# Patient Record
Sex: Male | Born: 2000 | Race: White | Hispanic: No | Marital: Single | Smoking: Never smoker
Health system: Southern US, Community
[De-identification: ages and names within clinical notes are randomized; demographics above are authoritative.]

## PROBLEM LIST (undated history)

## (undated) DIAGNOSIS — S60211A Contusion of right wrist, initial encounter: Secondary | ICD-10-CM

## (undated) DIAGNOSIS — R41840 Attention and concentration deficit: Secondary | ICD-10-CM

## (undated) DIAGNOSIS — F909 Attention-deficit hyperactivity disorder, unspecified type: Secondary | ICD-10-CM

## (undated) DIAGNOSIS — R1110 Vomiting, unspecified: Secondary | ICD-10-CM

## (undated) DIAGNOSIS — F329 Major depressive disorder, single episode, unspecified: Secondary | ICD-10-CM

## (undated) DIAGNOSIS — H539 Unspecified visual disturbance: Secondary | ICD-10-CM

## (undated) DIAGNOSIS — N209 Urinary calculus, unspecified: Secondary | ICD-10-CM

## (undated) DIAGNOSIS — T7840XA Allergy, unspecified, initial encounter: Secondary | ICD-10-CM

## (undated) DIAGNOSIS — R4689 Other symptoms and signs involving appearance and behavior: Secondary | ICD-10-CM

## (undated) DIAGNOSIS — S52521A Torus fracture of lower end of right radius, initial encounter for closed fracture: Secondary | ICD-10-CM

## (undated) DIAGNOSIS — K219 Gastro-esophageal reflux disease without esophagitis: Secondary | ICD-10-CM

## (undated) DIAGNOSIS — F32A Depression, unspecified: Secondary | ICD-10-CM

## (undated) DIAGNOSIS — F419 Anxiety disorder, unspecified: Secondary | ICD-10-CM

## (undated) DIAGNOSIS — G44229 Chronic tension-type headache, not intractable: Secondary | ICD-10-CM

## (undated) HISTORY — DX: Other symptoms and signs involving appearance and behavior: R46.89

## (undated) HISTORY — DX: Torus fracture of lower end of right radius, initial encounter for closed fracture: S52.521A

## (undated) HISTORY — DX: Gastro-esophageal reflux disease without esophagitis: K21.9

## (undated) HISTORY — DX: Chronic tension-type headache, not intractable: G44.229

## (undated) HISTORY — DX: Contusion of right wrist, initial encounter: S60.211A

## (undated) HISTORY — DX: Urinary calculus, unspecified: N20.9

## (undated) HISTORY — DX: Vomiting, unspecified: R11.10

## (undated) HISTORY — DX: Attention-deficit hyperactivity disorder, unspecified type: F90.9

## (undated) HISTORY — DX: Unspecified visual disturbance: H53.9

---
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
## 2020-06-30 ENCOUNTER — Emergency Department (HOSPITAL_COMMUNITY)
Admission: EM | Admit: 2020-06-30 | Discharge: 2020-07-01 | Disposition: A | Discharge status: Home / Self Care | Payer: 59 | Attending: Emergency Medicine | Admitting: Emergency Medicine

## 2020-06-30 ENCOUNTER — Other Ambulatory Visit: Payer: Self-pay

## 2020-06-30 DIAGNOSIS — N281 Cyst of kidney, acquired: Secondary | ICD-10-CM | POA: Diagnosis not present

## 2020-06-30 DIAGNOSIS — R109 Unspecified abdominal pain: Secondary | ICD-10-CM | POA: Diagnosis present

## 2020-06-30 DIAGNOSIS — K219 Gastro-esophageal reflux disease without esophagitis: Secondary | ICD-10-CM | POA: Diagnosis not present

## 2020-06-30 DIAGNOSIS — K760 Fatty (change of) liver, not elsewhere classified: Secondary | ICD-10-CM | POA: Diagnosis not present

## 2020-06-30 DIAGNOSIS — J029 Acute pharyngitis, unspecified: Secondary | ICD-10-CM

## 2020-06-30 NOTE — ED Notes (Addendum)
Pt. Felt like he had strep throat this morning and now his kidney are " killing him".   Hx kidney stones.

## 2020-07-01 ENCOUNTER — Emergency Department (HOSPITAL_COMMUNITY): Payer: 59

## 2020-07-01 LAB — CBC WITH DIFFERENTIAL/PLATELET
Abs Immature Granulocytes: 0.06 10*3/uL (ref 0.00–0.07)
Basophils Absolute: 0 10*3/uL (ref 0.0–0.1)
Basophils Relative: 0 %
Eosinophils Absolute: 0 10*3/uL (ref 0.0–0.5)
Eosinophils Relative: 0 %
HCT: 45.3 % (ref 39.0–52.0)
Hemoglobin: 15.3 g/dL (ref 13.0–17.0)
Immature Granulocytes: 0 %
Lymphocytes Relative: 7 %
Lymphs Abs: 1.2 10*3/uL (ref 0.7–4.0)
MCH: 28.9 pg (ref 26.0–34.0)
MCHC: 33.8 g/dL (ref 30.0–36.0)
MCV: 85.6 fL (ref 80.0–100.0)
Monocytes Absolute: 1 10*3/uL (ref 0.1–1.0)
Monocytes Relative: 6 %
Neutro Abs: 14.7 10*3/uL — ABNORMAL HIGH (ref 1.7–7.7)
Neutrophils Relative %: 87 %
Platelets: 233 10*3/uL (ref 150–400)
RBC: 5.29 MIL/uL (ref 4.22–5.81)
RDW: 12.5 % (ref 11.5–15.5)
WBC: 16.9 10*3/uL — ABNORMAL HIGH (ref 4.0–10.5)
nRBC: 0 % (ref 0.0–0.2)

## 2020-07-01 LAB — URINALYSIS, ROUTINE W REFLEX MICROSCOPIC
Bilirubin Urine: NEGATIVE
Glucose, UA: NEGATIVE mg/dL
Hgb urine dipstick: NEGATIVE
Ketones, ur: 80 mg/dL — AB
Leukocytes,Ua: NEGATIVE
Nitrite: NEGATIVE
Protein, ur: 100 mg/dL — AB
Specific Gravity, Urine: 1.028 (ref 1.005–1.030)
pH: 5 (ref 5.0–8.0)

## 2020-07-01 LAB — BASIC METABOLIC PANEL
Anion gap: 12 (ref 5–15)
BUN: 12 mg/dL (ref 6–20)
CO2: 22 mmol/L (ref 22–32)
Calcium: 9.4 mg/dL (ref 8.9–10.3)
Chloride: 104 mmol/L (ref 98–111)
Creatinine, Ser: 0.8 mg/dL (ref 0.61–1.24)
GFR calc Af Amer: >60 mL/min (ref >60)
GFR calc non Af Amer: >60 mL/min (ref >60)
Glucose, Bld: 107 mg/dL — ABNORMAL HIGH (ref 70–99)
Potassium: 3.5 mmol/L (ref 3.5–5.1)
Sodium: 138 mmol/L (ref 135–145)

## 2020-07-01 LAB — GROUP A STREP BY PCR: Group A Strep by PCR: DETECTED — AB

## 2020-07-01 MED ORDER — PENICILLIN G BENZATHINE 1200000 UNIT/2ML IM SUSP
1.2000 10*6.[IU] | Freq: Once | INTRAMUSCULAR | Status: AC
  Administered 2020-07-01: 1.2 10*6.[IU] via INTRAMUSCULAR
  Filled 2020-07-01: qty 2

## 2020-07-01 MED ORDER — KETOROLAC TROMETHAMINE 30 MG/ML IJ SOLN
30.0000 mg | Freq: Once | INTRAMUSCULAR | Status: AC
  Administered 2020-07-01: 30 mg via INTRAVENOUS
  Filled 2020-07-01: qty 1

## 2020-07-01 NOTE — ED Notes (Signed)
Patient discharged no concerns at this time  
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
## 2020-07-01 NOTE — Discharge Instructions (Addendum)
You strep test was positive.  The penicillin you are given in the emergency department should treat this completely.  You can take Tylenol for pain.  You did have some kidney stones within the kidney along with a possible cyst on your kidney.  You should follow-up with your doctor, you may need a kidney ultrasound at a future point in time.Marland Kitchen

## 2020-08-31 ENCOUNTER — Emergency Department (HOSPITAL_COMMUNITY): Payer: 59

## 2020-08-31 ENCOUNTER — Emergency Department (HOSPITAL_COMMUNITY)
Admission: EM | Admit: 2020-08-31 | Discharge: 2020-08-31 | Disposition: A | Discharge status: Home / Self Care | Payer: 59 | Attending: Emergency Medicine | Admitting: Emergency Medicine

## 2020-08-31 ENCOUNTER — Encounter (HOSPITAL_COMMUNITY): Payer: Self-pay

## 2020-08-31 ENCOUNTER — Other Ambulatory Visit: Payer: Self-pay

## 2020-08-31 DIAGNOSIS — R109 Unspecified abdominal pain: Secondary | ICD-10-CM | POA: Diagnosis present

## 2020-08-31 DIAGNOSIS — N200 Calculus of kidney: Secondary | ICD-10-CM | POA: Insufficient documentation

## 2020-08-31 DIAGNOSIS — R319 Hematuria, unspecified: Secondary | ICD-10-CM | POA: Insufficient documentation

## 2020-08-31 LAB — URINALYSIS, ROUTINE W REFLEX MICROSCOPIC
Bacteria, UA: NONE SEEN
Bilirubin Urine: NEGATIVE
Glucose, UA: NEGATIVE mg/dL
Ketones, ur: NEGATIVE mg/dL
Leukocytes,Ua: NEGATIVE
Nitrite: NEGATIVE
Protein, ur: NEGATIVE mg/dL
Specific Gravity, Urine: 1.013 (ref 1.005–1.030)
pH: 6 (ref 5.0–8.0)

## 2020-08-31 MED ORDER — ONDANSETRON 4 MG PO TBDP: 4.0000 mg | ORAL_TABLET | Freq: Three times a day (TID) | ORAL | 0 refills | Status: DC | PRN

## 2020-08-31 MED ORDER — HYDROCODONE-ACETAMINOPHEN 5-325 MG PO TABS: 1.0000 | ORAL_TABLET | ORAL | 0 refills | Status: AC | PRN

## 2020-08-31 MED ORDER — TAMSULOSIN HCL 0.4 MG PO CAPS: 0.4000 mg | ORAL_CAPSULE | Freq: Every day | ORAL | 0 refills | Status: DC

## 2020-08-31 NOTE — Discharge Instructions (Signed)
Return if any problems.
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
## 2020-08-31 NOTE — ED Triage Notes (Signed)
patient c/o intermittent left flank pain and hematuria since approx 0300 today. patient states he has known kidney stones.

## 2021-05-30 ENCOUNTER — Other Ambulatory Visit: Payer: Self-pay

## 2021-05-30 ENCOUNTER — Emergency Department (HOSPITAL_COMMUNITY)
Admission: EM | Admit: 2021-05-30 | Discharge: 2021-05-30 | Disposition: A | Discharge status: Home / Self Care | Payer: 59 | Attending: Emergency Medicine | Admitting: Emergency Medicine

## 2021-05-30 ENCOUNTER — Encounter (HOSPITAL_COMMUNITY): Payer: Self-pay | Admitting: Emergency Medicine

## 2021-05-30 ENCOUNTER — Emergency Department (HOSPITAL_COMMUNITY): Payer: 59

## 2021-05-30 DIAGNOSIS — K219 Gastro-esophageal reflux disease without esophagitis: Secondary | ICD-10-CM | POA: Insufficient documentation

## 2021-05-30 DIAGNOSIS — N23 Unspecified renal colic: Secondary | ICD-10-CM

## 2021-05-30 DIAGNOSIS — R109 Unspecified abdominal pain: Secondary | ICD-10-CM | POA: Diagnosis present

## 2021-05-30 DIAGNOSIS — N200 Calculus of kidney: Secondary | ICD-10-CM

## 2021-05-30 LAB — BASIC METABOLIC PANEL
Anion gap: 9 (ref 5–15)
BUN: 15 mg/dL (ref 6–20)
CO2: 25 mmol/L (ref 22–32)
Calcium: 9.3 mg/dL (ref 8.9–10.3)
Chloride: 110 mmol/L (ref 98–111)
Creatinine, Ser: 1.09 mg/dL (ref 0.61–1.24)
GFR, Estimated: >60 mL/min (ref >60)
Glucose, Bld: 118 mg/dL — ABNORMAL HIGH (ref 70–99)
Potassium: 3.7 mmol/L (ref 3.5–5.1)
Sodium: 144 mmol/L (ref 135–145)

## 2021-05-30 LAB — URINALYSIS, ROUTINE W REFLEX MICROSCOPIC
Bilirubin Urine: NEGATIVE
Glucose, UA: NEGATIVE mg/dL
Ketones, ur: NEGATIVE mg/dL
Leukocytes,Ua: NEGATIVE
Nitrite: NEGATIVE
Protein, ur: 30 mg/dL — AB
RBC / HPF: >50 RBC/hpf — ABNORMAL HIGH (ref 0–5)
Specific Gravity, Urine: 1.013 (ref 1.005–1.030)
pH: 6 (ref 5.0–8.0)

## 2021-05-30 LAB — CBC
HCT: 43.1 % (ref 39.0–52.0)
Hemoglobin: 14.1 g/dL (ref 13.0–17.0)
MCH: 29.1 pg (ref 26.0–34.0)
MCHC: 32.7 g/dL (ref 30.0–36.0)
MCV: 88.9 fL (ref 80.0–100.0)
Platelets: 207 10*3/uL (ref 150–400)
RBC: 4.85 MIL/uL (ref 4.22–5.81)
RDW: 12.5 % (ref 11.5–15.5)
WBC: 9.4 10*3/uL (ref 4.0–10.5)
nRBC: 0 % (ref 0.0–0.2)

## 2021-05-30 MED ORDER — OXYCODONE HCL 5 MG PO TABS: 2.5000 mg | ORAL_TABLET | Freq: Four times a day (QID) | ORAL | 0 refills | Status: DC | PRN

## 2021-05-30 MED ORDER — ONDANSETRON HCL 4 MG/2ML IJ SOLN
4.0000 mg | Freq: Once | INTRAMUSCULAR | Status: AC
  Administered 2021-05-30: 4 mg via INTRAVENOUS
  Filled 2021-05-30: qty 2

## 2021-05-30 MED ORDER — ONDANSETRON HCL 4 MG PO TABS: 4.0000 mg | ORAL_TABLET | Freq: Three times a day (TID) | ORAL | 0 refills | Status: DC | PRN

## 2021-05-30 MED ORDER — TAMSULOSIN HCL 0.4 MG PO CAPS: 0.4000 mg | ORAL_CAPSULE | Freq: Two times a day (BID) | ORAL | 0 refills | Status: DC

## 2021-05-30 MED ORDER — MORPHINE SULFATE (PF) 4 MG/ML IV SOLN
4.0000 mg | Freq: Once | INTRAVENOUS | Status: AC
  Administered 2021-05-30: 4 mg via INTRAVENOUS
  Filled 2021-05-30: qty 1

## 2021-05-30 MED ORDER — KETOROLAC TROMETHAMINE 10 MG PO TABS: 10.0000 mg | ORAL_TABLET | Freq: Four times a day (QID) | ORAL | 0 refills | Status: DC | PRN

## 2021-05-30 MED ORDER — KETOROLAC TROMETHAMINE 30 MG/ML IJ SOLN
30.0000 mg | Freq: Once | INTRAMUSCULAR | Status: AC
  Administered 2021-05-30: 30 mg via INTRAVENOUS
  Filled 2021-05-30: qty 1

## 2021-05-30 NOTE — Discharge Instructions (Addendum)
Return to the ED immediately if you develop fever, uncontrolled pain or vomiting, or other concerns. ° °

## 2021-05-30 NOTE — ED Notes (Signed)
Patient transported to CT 
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
## 2021-05-30 NOTE — ED Notes (Signed)
Pt said he cannot use the bathroom at this time

## 2021-05-30 NOTE — ED Triage Notes (Signed)
Patient presents with sudden onset of right flank pain that began around 0430 this morning. Patient took a vicodin that he had from a previous prescription approx. 45 minutes ago without relief. Patient endorses N/V. Patient states urine is dark.   Hx of kidney stones, known retained stones in his kidney.

## 2021-08-30 ENCOUNTER — Encounter (HOSPITAL_BASED_OUTPATIENT_CLINIC_OR_DEPARTMENT_OTHER): Payer: Self-pay | Admitting: Urology

## 2021-08-30 ENCOUNTER — Other Ambulatory Visit: Payer: Self-pay

## 2021-08-30 ENCOUNTER — Emergency Department (HOSPITAL_BASED_OUTPATIENT_CLINIC_OR_DEPARTMENT_OTHER): Payer: 59

## 2021-08-30 ENCOUNTER — Emergency Department (HOSPITAL_BASED_OUTPATIENT_CLINIC_OR_DEPARTMENT_OTHER)
Admission: EM | Admit: 2021-08-30 | Discharge: 2021-08-31 | Disposition: A | Discharge status: Home / Self Care | Payer: 59 | Attending: Emergency Medicine | Admitting: Emergency Medicine

## 2021-08-30 DIAGNOSIS — J101 Influenza due to other identified influenza virus with other respiratory manifestations: Secondary | ICD-10-CM | POA: Diagnosis not present

## 2021-08-30 DIAGNOSIS — Z20822 Contact with and (suspected) exposure to covid-19: Secondary | ICD-10-CM | POA: Diagnosis not present

## 2021-08-30 DIAGNOSIS — R509 Fever, unspecified: Secondary | ICD-10-CM | POA: Diagnosis present

## 2021-08-30 LAB — TROPONIN I (HIGH SENSITIVITY): Troponin I (High Sensitivity): 3 ng/L (ref <18)

## 2021-08-30 LAB — CBC
HCT: 44.1 % (ref 39.0–52.0)
Hemoglobin: 15 g/dL (ref 13.0–17.0)
MCH: 29 pg (ref 26.0–34.0)
MCHC: 34 g/dL (ref 30.0–36.0)
MCV: 85.1 fL (ref 80.0–100.0)
Platelets: 220 10*3/uL (ref 150–400)
RBC: 5.18 MIL/uL (ref 4.22–5.81)
RDW: 12.4 % (ref 11.5–15.5)
WBC: 6.9 10*3/uL (ref 4.0–10.5)
nRBC: 0 % (ref 0.0–0.2)

## 2021-08-30 LAB — RESP PANEL BY RT-PCR (FLU A&B, COVID) ARPGX2
Influenza A by PCR: POSITIVE — AB
Influenza B by PCR: NEGATIVE
SARS Coronavirus 2 by RT PCR: NEGATIVE

## 2021-08-30 LAB — BASIC METABOLIC PANEL
Anion gap: 10 (ref 5–15)
BUN: 12 mg/dL (ref 6–20)
CO2: 24 mmol/L (ref 22–32)
Calcium: 8.7 mg/dL — ABNORMAL LOW (ref 8.9–10.3)
Chloride: 107 mmol/L (ref 98–111)
Creatinine, Ser: 0.83 mg/dL (ref 0.61–1.24)
GFR, Estimated: >60 mL/min (ref >60)
Glucose, Bld: 106 mg/dL — ABNORMAL HIGH (ref 70–99)
Potassium: 3.6 mmol/L (ref 3.5–5.1)
Sodium: 141 mmol/L (ref 135–145)

## 2021-08-30 NOTE — ED Triage Notes (Signed)
Cough since Wednesday,  SOB , states "lungs feel Crackly" Got lightheaded today and had episode of vomiting.  State fever at home of 101.5 at home

## 2021-08-31 MED ORDER — ONDANSETRON 4 MG PO TBDP: 4.0000 mg | ORAL_TABLET | Freq: Three times a day (TID) | ORAL | 0 refills | Status: DC | PRN

## 2021-08-31 NOTE — Discharge Instructions (Addendum)
He was seen today for upper respiratory symptoms and vomiting.  Your flu test is positive.  You tested positive for influenza A.  Take Zofran, Tylenol, ibuprofen as needed.  Make sure you are staying hydrated.  You may return to work when 24 hours fever free or symptom-free.
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
## 2021-11-01 ENCOUNTER — Emergency Department (HOSPITAL_BASED_OUTPATIENT_CLINIC_OR_DEPARTMENT_OTHER)
Admission: EM | Admit: 2021-11-01 | Discharge: 2021-11-01 | Disposition: A | Discharge status: Home / Self Care | Payer: 59 | Attending: Emergency Medicine | Admitting: Emergency Medicine

## 2021-11-01 ENCOUNTER — Emergency Department (HOSPITAL_BASED_OUTPATIENT_CLINIC_OR_DEPARTMENT_OTHER): Payer: 59

## 2021-11-01 ENCOUNTER — Other Ambulatory Visit: Payer: Self-pay

## 2021-11-01 ENCOUNTER — Encounter (HOSPITAL_BASED_OUTPATIENT_CLINIC_OR_DEPARTMENT_OTHER): Payer: Self-pay | Admitting: Emergency Medicine

## 2021-11-01 DIAGNOSIS — N200 Calculus of kidney: Secondary | ICD-10-CM

## 2021-11-01 DIAGNOSIS — R109 Unspecified abdominal pain: Secondary | ICD-10-CM | POA: Diagnosis present

## 2021-11-01 LAB — CBC WITH DIFFERENTIAL/PLATELET
Abs Immature Granulocytes: 0.02 10*3/uL (ref 0.00–0.07)
Basophils Absolute: 0 10*3/uL (ref 0.0–0.1)
Basophils Relative: 0 %
Eosinophils Absolute: 0.2 10*3/uL (ref 0.0–0.5)
Eosinophils Relative: 2 %
HCT: 42.4 % (ref 39.0–52.0)
Hemoglobin: 14.2 g/dL (ref 13.0–17.0)
Immature Granulocytes: 0 %
Lymphocytes Relative: 32 %
Lymphs Abs: 2.5 10*3/uL (ref 0.7–4.0)
MCH: 28.6 pg (ref 26.0–34.0)
MCHC: 33.5 g/dL (ref 30.0–36.0)
MCV: 85.5 fL (ref 80.0–100.0)
Monocytes Absolute: 0.6 10*3/uL (ref 0.1–1.0)
Monocytes Relative: 8 %
Neutro Abs: 4.4 10*3/uL (ref 1.7–7.7)
Neutrophils Relative %: 58 %
Platelets: 230 10*3/uL (ref 150–400)
RBC: 4.96 MIL/uL (ref 4.22–5.81)
RDW: 12.5 % (ref 11.5–15.5)
WBC: 7.6 10*3/uL (ref 4.0–10.5)
nRBC: 0 % (ref 0.0–0.2)

## 2021-11-01 LAB — COMPREHENSIVE METABOLIC PANEL
ALT: 24 U/L (ref 0–44)
AST: 21 U/L (ref 15–41)
Albumin: 4.1 g/dL (ref 3.5–5.0)
Alkaline Phosphatase: 95 U/L (ref 38–126)
Anion gap: 8 (ref 5–15)
BUN: 15 mg/dL (ref 6–20)
CO2: 23 mmol/L (ref 22–32)
Calcium: 9.1 mg/dL (ref 8.9–10.3)
Chloride: 106 mmol/L (ref 98–111)
Creatinine, Ser: 0.95 mg/dL (ref 0.61–1.24)
GFR, Estimated: >60 mL/min (ref >60)
Glucose, Bld: 114 mg/dL — ABNORMAL HIGH (ref 70–99)
Potassium: 3.8 mmol/L (ref 3.5–5.1)
Sodium: 137 mmol/L (ref 135–145)
Total Bilirubin: 0.8 mg/dL (ref 0.3–1.2)
Total Protein: 7.7 g/dL (ref 6.5–8.1)

## 2021-11-01 LAB — URINALYSIS, ROUTINE W REFLEX MICROSCOPIC
Glucose, UA: NEGATIVE mg/dL
Ketones, ur: NEGATIVE mg/dL
Leukocytes,Ua: NEGATIVE
Nitrite: POSITIVE — AB
Protein, ur: 100 mg/dL — AB
Specific Gravity, Urine: >=1.03 (ref 1.005–1.030)
pH: 6 (ref 5.0–8.0)

## 2021-11-01 LAB — URINALYSIS, MICROSCOPIC (REFLEX): RBC / HPF: >50 RBC/hpf (ref 0–5)

## 2021-11-01 LAB — LIPASE, BLOOD: Lipase: 32 U/L (ref 11–51)

## 2021-11-01 MED ORDER — KETOROLAC TROMETHAMINE 30 MG/ML IJ SOLN
30.0000 mg | Freq: Once | INTRAMUSCULAR | Status: AC
  Administered 2021-11-01: 30 mg via INTRAVENOUS
  Filled 2021-11-01: qty 1

## 2021-11-01 MED ORDER — ONDANSETRON 4 MG PO TBDP: 4.0000 mg | ORAL_TABLET | Freq: Three times a day (TID) | ORAL | 0 refills | Status: DC | PRN

## 2021-11-01 MED ORDER — TAMSULOSIN HCL 0.4 MG PO CAPS: 0.4000 mg | ORAL_CAPSULE | Freq: Every day | ORAL | 0 refills | Status: AC

## 2021-11-01 MED ORDER — ONDANSETRON HCL 4 MG/2ML IJ SOLN
4.0000 mg | Freq: Once | INTRAMUSCULAR | Status: AC
  Administered 2021-11-01: 4 mg via INTRAVENOUS
  Filled 2021-11-01: qty 2

## 2021-11-01 MED ORDER — HYDROCODONE-ACETAMINOPHEN 5-325 MG PO TABS: 1.0000 | ORAL_TABLET | ORAL | 0 refills | Status: DC | PRN

## 2021-11-01 MED ORDER — CEPHALEXIN 500 MG PO CAPS: 500.0000 mg | ORAL_CAPSULE | Freq: Three times a day (TID) | ORAL | 0 refills | Status: AC

## 2021-11-01 MED ORDER — LACTATED RINGERS IV BOLUS
1000.0000 mL | Freq: Once | INTRAVENOUS | Status: AC
  Administered 2021-11-01: 1000 mL via INTRAVENOUS

## 2021-11-01 NOTE — ED Notes (Signed)
Patient transported to CT 

## 2021-11-01 NOTE — ED Triage Notes (Signed)
R sided flank pain and hematuria since last night. Pt states "it feels like I gotta force it out" when he urinates. Reports hx of kidney stones. Denies fever or dysuria.
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
## 2021-11-02 LAB — URINE CULTURE: Culture: NO GROWTH

## 2021-11-05 ENCOUNTER — Ambulatory Visit: Payer: 59 | Admitting: Nurse Practitioner

## 2021-11-17 ENCOUNTER — Emergency Department (HOSPITAL_BASED_OUTPATIENT_CLINIC_OR_DEPARTMENT_OTHER)
Admission: EM | Admit: 2021-11-17 | Discharge: 2021-11-17 | Disposition: A | Discharge status: Home / Self Care | Payer: 59 | Attending: Emergency Medicine | Admitting: Emergency Medicine

## 2021-11-17 ENCOUNTER — Encounter (HOSPITAL_BASED_OUTPATIENT_CLINIC_OR_DEPARTMENT_OTHER): Payer: Self-pay

## 2021-11-17 ENCOUNTER — Other Ambulatory Visit: Payer: Self-pay

## 2021-11-17 ENCOUNTER — Emergency Department (HOSPITAL_BASED_OUTPATIENT_CLINIC_OR_DEPARTMENT_OTHER): Payer: 59

## 2021-11-17 DIAGNOSIS — N132 Hydronephrosis with renal and ureteral calculous obstruction: Secondary | ICD-10-CM | POA: Insufficient documentation

## 2021-11-17 DIAGNOSIS — R109 Unspecified abdominal pain: Secondary | ICD-10-CM | POA: Diagnosis present

## 2021-11-17 DIAGNOSIS — N201 Calculus of ureter: Secondary | ICD-10-CM

## 2021-11-17 LAB — URINALYSIS, ROUTINE W REFLEX MICROSCOPIC
Bilirubin Urine: NEGATIVE
Glucose, UA: NEGATIVE mg/dL
Ketones, ur: 80 mg/dL — AB
Leukocytes,Ua: NEGATIVE
Nitrite: NEGATIVE
Protein, ur: 100 mg/dL — AB
Specific Gravity, Urine: >=1.03 (ref 1.005–1.030)
pH: 6 (ref 5.0–8.0)

## 2021-11-17 LAB — BASIC METABOLIC PANEL
Anion gap: 12 (ref 5–15)
BUN: 17 mg/dL (ref 6–20)
CO2: 21 mmol/L — ABNORMAL LOW (ref 22–32)
Calcium: 9.5 mg/dL (ref 8.9–10.3)
Chloride: 104 mmol/L (ref 98–111)
Creatinine, Ser: 1.36 mg/dL — ABNORMAL HIGH (ref 0.61–1.24)
GFR, Estimated: >60 mL/min (ref >60)
Glucose, Bld: 93 mg/dL (ref 70–99)
Potassium: 3.7 mmol/L (ref 3.5–5.1)
Sodium: 137 mmol/L (ref 135–145)

## 2021-11-17 LAB — URINALYSIS, MICROSCOPIC (REFLEX): WBC, UA: NONE SEEN WBC/hpf (ref 0–5)

## 2021-11-17 LAB — CBC WITH DIFFERENTIAL/PLATELET
Abs Immature Granulocytes: 0.04 10*3/uL (ref 0.00–0.07)
Basophils Absolute: 0 10*3/uL (ref 0.0–0.1)
Basophils Relative: 0 %
Eosinophils Absolute: 0.1 10*3/uL (ref 0.0–0.5)
Eosinophils Relative: 1 %
HCT: 44.4 % (ref 39.0–52.0)
Hemoglobin: 15.5 g/dL (ref 13.0–17.0)
Immature Granulocytes: 0 %
Lymphocytes Relative: 18 %
Lymphs Abs: 1.9 10*3/uL (ref 0.7–4.0)
MCH: 29.9 pg (ref 26.0–34.0)
MCHC: 34.9 g/dL (ref 30.0–36.0)
MCV: 85.5 fL (ref 80.0–100.0)
Monocytes Absolute: 0.7 10*3/uL (ref 0.1–1.0)
Monocytes Relative: 7 %
Neutro Abs: 7.7 10*3/uL (ref 1.7–7.7)
Neutrophils Relative %: 74 %
Platelets: 266 10*3/uL (ref 150–400)
RBC: 5.19 MIL/uL (ref 4.22–5.81)
RDW: 12.4 % (ref 11.5–15.5)
WBC: 10.4 10*3/uL (ref 4.0–10.5)
nRBC: 0 % (ref 0.0–0.2)

## 2021-11-17 MED ORDER — KETOROLAC TROMETHAMINE 15 MG/ML IJ SOLN
15.0000 mg | Freq: Once | INTRAMUSCULAR | Status: AC
  Administered 2021-11-17: 15 mg via INTRAVENOUS
  Filled 2021-11-17: qty 1

## 2021-11-17 MED ORDER — TAMSULOSIN HCL 0.4 MG PO CAPS
0.4000 mg | ORAL_CAPSULE | Freq: Once | ORAL | Status: AC
  Administered 2021-11-17: 0.4 mg via ORAL
  Filled 2021-11-17: qty 1

## 2021-11-17 MED ORDER — TAMSULOSIN HCL 0.4 MG PO CAPS: 0.4000 mg | ORAL_CAPSULE | Freq: Every day | ORAL | 0 refills | Status: DC

## 2021-11-17 MED ORDER — ONDANSETRON HCL 4 MG/2ML IJ SOLN
4.0000 mg | Freq: Once | INTRAMUSCULAR | Status: AC
  Administered 2021-11-17: 4 mg via INTRAVENOUS
  Filled 2021-11-17: qty 2

## 2021-11-17 MED ORDER — FENTANYL CITRATE PF 50 MCG/ML IJ SOSY
25.0000 ug | PREFILLED_SYRINGE | Freq: Once | INTRAMUSCULAR | Status: AC
  Administered 2021-11-17: 25 ug via INTRAVENOUS
  Filled 2021-11-17: qty 1

## 2021-11-17 MED ORDER — SODIUM CHLORIDE 0.9 % IV BOLUS
1000.0000 mL | Freq: Once | INTRAVENOUS | Status: AC
  Administered 2021-11-17: 1000 mL via INTRAVENOUS

## 2021-11-17 NOTE — ED Provider Notes (Signed)
[facility] EMERGENCY DEPARTMENT Provider Note   CSN: [id number] Arrival date & time: 11/17/21  1709     History Chief Complaint  Patient presents with   Flank Pain    Samuel Hahn is a 21 y.o. male with history of ADHD and nephrolithiasis who presents to the emergency department with right flank pain that started around this morning.  Patient took 5 mg of oxycodone this morning which helped a little bit.  Approximate around 2 to 3 PM today he began having increased level of pain and nausea and dry heaving.  Patient also having diaphoresis.  Right flank pain radiates into the right lower quadrant.  No urinary symptoms.  No fever.   Flank Pain      Home Medications Prior to Admission medications   Medication Sig Start Date End Date Taking? Authorizing Provider  tamsulosin (FLOMAX) 0.4 MG CAPS capsule Take 1 capsule (0.4 mg total) by mouth daily. 11/17/21  Yes Raul Del, Faydra Korman M, PA-C  fexofenadine (ALLERGY 24-HR) 180 MG tablet Take 1 tablet (180 mg total) by mouth daily. Patient not taking: Reported on 07/01/2020 02/25/16 02/24/17  Leveda Anna, NP  HYDROcodone-acetaminophen (NORCO/VICODIN) 5-325 MG tablet Take 1-2 tablets by mouth every 4 (four) hours as needed. 11/01/21   Gareth Morgan, MD  ketorolac (TORADOL) 10 MG tablet Take 1 tablet (10 mg total) by mouth every 6 (six) hours as needed. 05/30/21   Margarita Mail, PA-C  lansoprazole (PREVACID) 15 MG capsule TAKE 1 CAPSULE BY MOUTH DAILY AT NOON Patient not taking: Reported on 07/01/2020 07/09/15 08/09/15  Marcha Solders, MD  ondansetron (ZOFRAN) 4 MG tablet Take 1 tablet (4 mg total) by mouth every 8 (eight) hours as needed for nausea or vomiting. 05/30/21   Margarita Mail, PA-C  ondansetron (ZOFRAN-ODT) 4 MG disintegrating tablet Take 1 tablet (4 mg total) by mouth every 8 (eight) hours as needed for nausea or vomiting. 11/01/21   Gareth Morgan, MD  propranolol (INDERAL) 20 MG tablet Take 1 tablet (20 mg total)  by mouth 2 (two) times daily. Patient not taking: Reported on 07/01/2020 01/12/17   Teressa Lower, MD      Allergies    Patient has no known allergies.    Review of Systems   Review of Systems  Genitourinary:  Positive for flank pain.  All other systems reviewed and are negative.  Physical Exam Updated Vital Signs BP 127/77    Pulse (!) 59    Temp 97.9 F (36.6 C) (Oral)    Resp 18    Ht 6\' 2"  (1.88 m)    Wt 110.7 kg    SpO2 100%    BMI 31.33 kg/m  Physical Exam Vitals and nursing note reviewed.  Constitutional:      General: He is not in acute distress.    Appearance: Normal appearance.  HENT:     Head: Normocephalic and atraumatic.  Eyes:     General:        Right eye: No discharge.        Left eye: No discharge.  Cardiovascular:     Comments: Regular rate and rhythm.  S1/S2 are distinct without any evidence of murmur, rubs, or gallops.  Radial pulses are 2+ bilaterally.  Dorsalis pedis pulses are 2+ bilaterally.  No evidence of pedal edema. Pulmonary:     Comments: Clear to auscultation bilaterally.  Normal effort.  No respiratory distress.  No evidence of wheezes, rales, or rhonchi heard throughout. Abdominal:  General: Abdomen is flat. Bowel sounds are normal. There is no distension.     Tenderness: There is abdominal tenderness in the right lower quadrant. There is right CVA tenderness. There is no guarding or rebound.  Musculoskeletal:        General: Normal range of motion.     Cervical back: Neck supple.  Skin:    General: Skin is warm and dry.     Findings: No rash.  Neurological:     General: No focal deficit present.     Mental Status: He is alert.  Psychiatric:        Mood and Affect: Mood normal.        Behavior: Behavior normal.    ED Results / Procedures / Treatments   Labs (all labs ordered are listed, but only abnormal results are displayed) Labs Reviewed  BASIC METABOLIC PANEL - Abnormal; Notable for the following components:      Result  Value   CO2 21 (*)    Creatinine, Ser 1.36 (*)    All other components within normal limits  URINALYSIS, ROUTINE W REFLEX MICROSCOPIC - Abnormal; Notable for the following components:   Hgb urine dipstick MODERATE (*)    Ketones, ur 80 (*)    Protein, ur 100 (*)    All other components within normal limits  URINALYSIS, MICROSCOPIC (REFLEX) - Abnormal; Notable for the following components:   Bacteria, UA RARE (*)    All other components within normal limits  CBC WITH DIFFERENTIAL/PLATELET    EKG None  Radiology CT Renal Stone Study  Result Date: 11/17/2021 CLINICAL DATA:  Right flank pain with hematuria. Diagnosed with kidney stone two weeks ago with persistent symptoms. EXAM: CT ABDOMEN AND PELVIS WITHOUT CONTRAST TECHNIQUE: Multidetector CT imaging of the abdomen and pelvis was performed following the standard protocol without IV contrast. RADIATION DOSE REDUCTION: This exam was performed according to the departmental dose-optimization program which includes automated exposure control, adjustment of the mA and/or kV according to patient size and/or use of iterative reconstruction technique. COMPARISON:  CT 11/01/2021 FINDINGS: Lower chest: Clear lung bases. No significant pleural or pericardial effusion. Hepatobiliary: The liver appears unremarkable as imaged in the noncontrast state. No evidence of gallstones, gallbladder wall thickening or biliary dilatation. Pancreas: Unremarkable. No pancreatic ductal dilatation or surrounding inflammatory changes. Spleen: Normal in size without focal abnormality. Adrenals/Urinary Tract: Both adrenal glands appear normal. Numerous nonobstructing bilateral renal calculi are again noted, largest in the lower pole of the left kidney, measuring 4 mm in diameter. There is progressive right-sided hydronephrosis secondary to an obstructing 4 mm calculus in the distal right ureter. This has progressed from the previous study and now is located in the pelvis,  approximately 6 cm above the ureterovesical junction. It is not well seen on the scout image due to overlap with the sacrum. There is an additional tiny calculus just proximal to this. The distal right ureter is decompressed. No left-sided hydronephrosis or significant perinephric soft tissue stranding. The bladder appears unremarkable. Stomach/Bowel: No enteric contrast administered. The stomach appears unremarkable for its degree of distension. No evidence of bowel wall thickening, distention or surrounding inflammatory change. The appendix appears normal. Vascular/Lymphatic: There are no enlarged abdominal or pelvic lymph nodes. No significant vascular findings on noncontrast imaging. Reproductive: The prostate gland and seminal vesicles appear normal. Other: No evidence of abdominal wall mass or hernia. No ascites. Musculoskeletal: No acute or significant osseous findings. IMPRESSION: 1. The previously demonstrated proximal right ureteral calculus has  progressed into the pelvis, now lying approximately 6 cm above the ureterovesical junction. This calculus measures 4 mm in diameter and is associated with progressive right-sided hydronephrosis. 2. Numerous additional nonobstructing bilateral renal calculi are unchanged. 3. No other significant findings. Electronically Signed   By: Richardean Sale M.D.   On: 11/17/2021 18:45    Procedures Procedures    Medications Ordered in ED Medications  tamsulosin (FLOMAX) capsule 0.4 mg (has no administration in time range)  fentaNYL (SUBLIMAZE) injection 25 mcg (25 mcg Intravenous Given 11/17/21 1810)  ondansetron (ZOFRAN) injection 4 mg (4 mg Intravenous Given 11/17/21 1809)  sodium chloride 0.9 % bolus 1,000 mL (0 mLs Intravenous Stopped 11/17/21 1936)  ketorolac (TORADOL) 15 MG/ML injection 15 mg (15 mg Intravenous Given 11/17/21 1937)    ED Course/ Medical Decision Making/ A&P                           Medical Decision Making Amount and/or Complexity of  Data Reviewed Labs: ordered. Radiology: ordered.  Risk Prescription drug management.   This patient presents to the ED for concern of right-sided flank pain and nausea, this involves an extensive number of treatment options, and is a complaint that carries with it a high risk of complications and morbidity.  The differential diagnosis includes nephrolithiasis, pyelonephritis, hepatobiliary disease including cholecystitis, UTI, gastritis, possible appendicitis.   Co morbidities that complicate the patient evaluation  History of nephrolithiasis and ADHD   Additional history obtained:  Additional history obtained from mother at bedside External records from outside source obtained and reviewed including old ED visit from 1/30 where he presented for similar symptoms at that time.  There was evidence of ureterolithiasis and nephrolithiasis bilaterally.  He was ultimately discharged with tamsulosin, Zofran, Keflex, and Norco.   Lab Tests:  I Ordered, and personally interpreted labs.  The pertinent results include: CBC did not reveal any significant abnormalities.  BMP did show slight elevation in his creatinine compared to baseline.  This is likely from the kidney stone that has been in the ureter for 2 weeks.  Urinalysis did not reveal any signs of infection although did show moderate blood which is consistent with kidney stone.   Imaging Studies ordered:  I ordered imaging studies including CT renal study I independently visualized and interpreted imaging which showed a 4 mm kidney stone close to the right UVJ. I agree with the radiologist interpretation   Cardiac Monitoring:  The patient was maintained on a cardiac monitor.  I personally viewed and interpreted the cardiac monitored which showed an underlying rhythm of: Normal sinus rhythm.  Oxygenating well on room air.   Medicines ordered and prescription drug management:  I ordered medication including fentanyl and Toradol for  acute pain.  I also gave him Zofran for nausea.  Reevaluation of the patient after these medicines showed that the patient improved I have reviewed the patients home medicines and have made adjustments as needed   Critical Interventions:  1 L of fluid bolus for fluid resuscitation.   Problem List / ED Course:  Right-sided flank pain secondary to ureterolithiasis with hydronephrosis   Reevaluation:  After the interventions noted above, I reevaluated the patient and found that they have :improved   Social Determinants of Health:  Lack of primary care follow-up   Dispostion:  After consideration of the diagnostic results and the patients response to treatment, I feel that the patent would benefit from outpatient follow-up.  I  will give him a referral to urology concerned that this is not his first time with kidney stones and does have multiple nephrolithiasis.  Patient is significantly better after pain medication and antiemetics.  Patient is comfortable going home.  He has enough pain medication from his previous visit.  Enough Zofran at home.  I will refill his Flomax.  I discussed with the patient preventive measures including citric acid from lemon and orange juice.  I discussed strict return precautions.  He is safe for discharge.  I will give him his first round of tamsulosin here in the department as it is 2032 and pharmacies are likely closed.  Final Clinical Impression(s) / ED Diagnoses Final diagnoses:  Ureterolithiasis    Rx / DC Orders ED Discharge Orders          Ordered    tamsulosin (FLOMAX) 0.4 MG CAPS capsule  Daily        11/17/21 2033              Hendricks Limes, PA-C 11/17/21 2034    Drenda Freeze, MD 11/19/21 1459

## 2021-11-17 NOTE — ED Triage Notes (Signed)
Pt reports right flank pain and hematuria; states he was diagnosed with a kidney stone on 1/30 and has still not passed it. He took hydrocodone is not helping - last dose 8am

## 2021-11-17 NOTE — ED Notes (Signed)
Pt A&OX4 ambulatory at d/c with independent steady gait. Pt reports he feels better. Pt verbalized understanding of d/c instructions, prescription and follow up care.

## 2021-11-17 NOTE — Discharge Instructions (Signed)
Please drink plenty of fluids.  You can start drinking citric drinks including or juice and water with lemon.  Avoid sugary sodas and energy drinks.  You can take Flomax until you passed the kidney stone and then you can stop.  I will also give you referral to urology.  Please call to schedule an appointment.  Return to the emergency department for worsening symptoms.

## 2022-02-11 ENCOUNTER — Ambulatory Visit: Payer: 59 | Admitting: Internal Medicine

## 2022-05-31 ENCOUNTER — Telehealth: Payer: Self-pay | Admitting: *Deleted

## 2022-05-31 NOTE — Telephone Encounter (Signed)
Called patient to advise him of a change of her appt. Left message on voicemail to return call.   Dr. Johnson will be out of the office on 9/1. Requesting to have patient come on Wednesday morning, August 30th only.  

## 2022-06-03 ENCOUNTER — Ambulatory Visit: Payer: 59 | Admitting: Internal Medicine

## 2022-07-29 ENCOUNTER — Ambulatory Visit: Payer: 59 | Admitting: Internal Medicine

## 2022-11-14 ENCOUNTER — Ambulatory Visit: Payer: 59 | Admitting: Internal Medicine

## 2023-02-03 ENCOUNTER — Emergency Department (HOSPITAL_BASED_OUTPATIENT_CLINIC_OR_DEPARTMENT_OTHER)
Admission: EM | Admit: 2023-02-03 | Discharge: 2023-02-03 | Disposition: A | Discharge status: Home / Self Care | Payer: 59 | Attending: Emergency Medicine | Admitting: Emergency Medicine

## 2023-02-03 ENCOUNTER — Other Ambulatory Visit (HOSPITAL_BASED_OUTPATIENT_CLINIC_OR_DEPARTMENT_OTHER): Payer: Self-pay

## 2023-02-03 ENCOUNTER — Encounter (HOSPITAL_BASED_OUTPATIENT_CLINIC_OR_DEPARTMENT_OTHER): Payer: Self-pay

## 2023-02-03 ENCOUNTER — Other Ambulatory Visit: Payer: Self-pay

## 2023-02-03 DIAGNOSIS — R319 Hematuria, unspecified: Secondary | ICD-10-CM | POA: Insufficient documentation

## 2023-02-03 LAB — URINALYSIS, ROUTINE W REFLEX MICROSCOPIC

## 2023-02-03 LAB — URINALYSIS, MICROSCOPIC (REFLEX): RBC / HPF: >50 RBC/hpf (ref 0–5)

## 2023-02-03 MED ORDER — ONDANSETRON 4 MG PO TBDP
4.0000 mg | ORAL_TABLET | Freq: Once | ORAL | Status: AC
Start: 2023-02-03 — End: 2023-02-03
  Administered 2023-02-03: 4 mg via ORAL
  Filled 2023-02-03: qty 1

## 2023-02-03 MED ORDER — TAMSULOSIN HCL 0.4 MG PO CAPS
0.4000 mg | ORAL_CAPSULE | Freq: Every day | ORAL | 0 refills | Status: AC
  Filled 2023-02-03: qty 7, 7d supply, fill #0

## 2023-02-03 MED ORDER — OXYCODONE HCL 5 MG PO TABS
5.0000 mg | ORAL_TABLET | Freq: Four times a day (QID) | ORAL | 0 refills | Status: AC | PRN
  Filled 2023-02-03: qty 10, 3d supply, fill #0

## 2023-02-03 MED ORDER — KETOROLAC TROMETHAMINE 60 MG/2ML IM SOLN
60.0000 mg | Freq: Once | INTRAMUSCULAR | Status: AC
  Administered 2023-02-03: 60 mg via INTRAMUSCULAR
  Filled 2023-02-03: qty 2

## 2023-02-03 MED ORDER — ONDANSETRON 4 MG PO TBDP
4.0000 mg | ORAL_TABLET | Freq: Three times a day (TID) | ORAL | 0 refills | Status: AC | PRN
  Filled 2023-02-03: qty 20, 7d supply, fill #0

## 2023-02-03 NOTE — Discharge Instructions (Signed)
Overall suspect you have a kidney stone.  Take Zofran for nausea.  Take Flomax to help get the stone out.  Take 1000 mg of Tylenol every 6 hours as needed for pain.  Take 800 mg ibuprofen every 8 hours as needed for pain.  Take Roxicodone which is a narcotic pain medicine for breakthrough pain.  This medication is sedating so do not take with alcohol or other drugs or dangerous activities including driving.  Please return if you develop a fever of 100.4 or greater or have uncontrollable pain/nausea and vomiting.  Follow-up with urology.

## 2023-02-03 NOTE — ED Provider Notes (Signed)
[facility] Provider Note   CSN: [id number] Arrival date & time: 02/03/23  0754     History  Chief Complaint  Patient presents with   Hematuria    Samuel Hahn is a 22 y.o. male.  Here with left flank pain since this morning.  Feels like kidney stones.  He has noticed little bit of blood in his urine.  Not having a ton of pain.  No nausea or vomiting or fever or chills.  Denies any pain with urination.  Denies any chest pain or shortness of breath.  Feels like his prior kidney stones.  Has not established with a urologist here since moving from [place].  Samuel Hahn history of kidney stones.  The history is provided by the patient.       Home Medications Prior to Admission medications   Medication Sig Start Date End Date Taking? Authorizing Provider  ondansetron (ZOFRAN-ODT) 4 MG disintegrating tablet Take 1 tablet (4 mg total) by mouth every 8 (eight) hours as needed for nausea or vomiting. 02/03/23  Yes Cheyne Bungert, DO  oxyCODONE (ROXICODONE) 5 MG immediate release tablet Take 1 tablet (5 mg total) by mouth every 6 (six) hours as needed for up to 15 doses for breakthrough pain. 02/03/23  Yes Jaylea Plourde, DO  tamsulosin (FLOMAX) 0.4 MG CAPS capsule Take 1 capsule (0.4 mg total) by mouth daily for 7 days. 02/03/23 02/10/23 Yes [name], DO  fexofenadine (ALLERGY 24-HR) 180 MG tablet Take 1 tablet (180 mg total) by mouth daily. Patient not taking: Reported on 07/01/2020 02/25/16 02/24/17  Samuel June, NP  lansoprazole (PREVACID) 15 MG capsule TAKE 1 CAPSULE BY MOUTH DAILY AT NOON Patient not taking: Reported on 07/01/2020 07/09/15 08/09/15  Samuel Hahn, MD  propranolol (INDERAL) 20 MG tablet Take 1 tablet (20 mg total) by mouth 2 (two) times daily. Patient not taking: Reported on 07/01/2020 01/12/17   Samuel Shavers, MD      Allergies    Patient has no known allergies.    Review of Systems   Review of Systems  Physical  Exam Updated Vital Signs BP (!) 136/90 (BP Location: Right Arm)   Pulse 62   Temp 98.3 F (36.8 C) (Oral)   Resp 17   Ht 6\' 2"  (1.88 m)   Wt 118.8 kg   SpO2 99%   BMI 33.64 kg/m  Physical Exam Vitals and nursing note reviewed.  Constitutional:      General: He is not in acute distress.    Appearance: He is well-developed. He is not ill-appearing.  HENT:     Head: Normocephalic and atraumatic.     Nose: Nose normal.     Mouth/Throat:     Mouth: Mucous membranes are moist.  Eyes:     Extraocular Movements: Extraocular movements intact.     Conjunctiva/sclera: Conjunctivae normal.     Pupils: Pupils are equal, round, and reactive to light.  Cardiovascular:     Rate and Rhythm: Normal rate and regular rhythm.     Pulses: Normal pulses.     Heart sounds: Normal heart sounds. No murmur heard. Pulmonary:     Effort: Pulmonary effort is normal. No respiratory distress.     Breath sounds: Normal breath sounds.  Abdominal:     Palpations: Abdomen is soft.     Tenderness: There is no abdominal tenderness.  Musculoskeletal:        General: No swelling.     Cervical back:  Normal range of motion and neck supple.  Skin:    General: Skin is warm and dry.     Capillary Refill: Capillary refill takes less than 2 seconds.  Neurological:     General: No focal deficit present.     Mental Status: He is alert.  Psychiatric:        Mood and Affect: Mood normal.     ED Results / Procedures / Treatments   Labs (all labs ordered are listed, but only abnormal results are displayed) Labs Reviewed  URINALYSIS, ROUTINE W REFLEX MICROSCOPIC - Abnormal; Notable for the following components:      Result Value   Color, Urine RED (*)    APPearance TURBID (*)    Glucose, UA   (*)    Value: TEST NOT REPORTED DUE TO COLOR INTERFERENCE OF URINE PIGMENT   Hgb urine dipstick   (*)    Value: TEST NOT REPORTED DUE TO COLOR INTERFERENCE OF URINE PIGMENT   Bilirubin Urine   (*)    Value: TEST NOT  REPORTED DUE TO COLOR INTERFERENCE OF URINE PIGMENT   Ketones, ur   (*)    Value: TEST NOT REPORTED DUE TO COLOR INTERFERENCE OF URINE PIGMENT   Protein, ur   (*)    Value: TEST NOT REPORTED DUE TO COLOR INTERFERENCE OF URINE PIGMENT   Nitrite   (*)    Value: TEST NOT REPORTED DUE TO COLOR INTERFERENCE OF URINE PIGMENT   Leukocytes,Ua   (*)    Value: TEST NOT REPORTED DUE TO COLOR INTERFERENCE OF URINE PIGMENT   All other components within normal limits  URINALYSIS, MICROSCOPIC (REFLEX) - Abnormal; Notable for the following components:   Bacteria, UA MANY (*)    All other components within normal limits    EKG None  Radiology No results found.  Procedures Procedures    Medications Ordered in ED Medications  ketorolac (TORADOL) injection 60 mg (60 mg Intramuscular Given 02/03/23 0828)  ondansetron (ZOFRAN-ODT) disintegrating tablet 4 mg (4 mg Oral Given 02/03/23 2536)    ED Course/ Medical Decision Making/ A&P                             Medical Decision Making Amount and/or Complexity of Data Reviewed Labs: ordered.  Risk Prescription drug management.   [name] is here with left flank pain.  Well-appearing.  Normal vitals.  History of kidney stones.  Likely new kidney stone.  He has very mild pain.  Hematuria.  He is not tender in the abdomen.  Have no concern for diverticulitis or other acute intra-abdominal process.  Certainly no concern for appendicitis.  Shared decision was made to just treat as if this is a small kidney stone.  He has no fever.  Will give him prescription for Zofran, oxycodone and Flomax.  If he develops fever or uncontrolled pain and nausea then to return for reevaluation and workup.  At this time I do not think he has an infected kidney stone.  I do not think there is any other emergent process going on.  He is well-appearing.  Understands return precautions.  Discharged in good condition.  This chart was dictated using voice recognition  software.  Despite best efforts to proofread,  errors can occur which can change the documentation meaning.         Final Clinical Impression(s) / ED Diagnoses Final diagnoses:  Hematuria, unspecified type  Rx / DC Orders ED Discharge Orders          Ordered    ondansetron (ZOFRAN-ODT) 4 MG disintegrating tablet  Every 8 hours PRN        02/03/23 0818    oxyCODONE (ROXICODONE) 5 MG immediate release tablet  Every 6 hours PRN        02/03/23 0818    tamsulosin (FLOMAX) 0.4 MG CAPS capsule  Daily        02/03/23 0818              Virgina Norfolk, DO 02/03/23 0840

## 2023-02-03 NOTE — ED Triage Notes (Signed)
Pt presents via pov with complaints of blood in urine that started this morning at approximately 5am. Pt also reports lower abdominal and left flank pain. + nausea.

## 2023-06-29 ENCOUNTER — Ambulatory Visit: Payer: BC Managed Care – PPO | Attending: Maternal & Fetal Medicine

## 2023-06-29 DIAGNOSIS — Z31440 Encounter of male for testing for genetic disease carrier status for procreative management: Secondary | ICD-10-CM | POA: Insufficient documentation

## 2023-07-11 LAB — HORIZON CUSTOM: REPORT SUMMARY: NEGATIVE

## 2023-07-14 ENCOUNTER — Telehealth: Payer: Self-pay

## 2023-07-14 NOTE — Telephone Encounter (Signed)
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 He shared that his reproductive partner Jaynee Eagles was next to him. Burl was not found to be a carrier for CF. This significantly reduces but does not eliminate the chance that he is a carrier. The chance that his and Jordyn's baby would be affected is very low. Please see report for details.  Sheppard Plumber, MS Genetic Counselor [facility] [phone]
